# Patient Record
Sex: Male | Born: 1965 | Race: White | Hispanic: No | Marital: Married | Smoking: Never smoker
Health system: Southern US, Community
[De-identification: ages and names within clinical notes are randomized; demographics above are authoritative.]

---

## 1997-12-28 ENCOUNTER — Emergency Department (HOSPITAL_COMMUNITY): Admission: EM | Admit: 1997-12-28 | Discharge: 1997-12-28 | Payer: Self-pay | Admitting: Emergency Medicine

## 2004-08-30 ENCOUNTER — Inpatient Hospital Stay (HOSPITAL_COMMUNITY): Admission: EM | Admit: 2004-08-30 | Discharge: 2004-09-01 | Payer: Self-pay | Admitting: Emergency Medicine

## 2004-10-03 ENCOUNTER — Encounter: Admission: RE | Admit: 2004-10-03 | Discharge: 2004-10-03 | Payer: Self-pay | Admitting: Orthopedic Surgery

## 2004-10-08 ENCOUNTER — Encounter (HOSPITAL_BASED_OUTPATIENT_CLINIC_OR_DEPARTMENT_OTHER): Admission: RE | Admit: 2004-10-08 | Discharge: 2005-01-06 | Payer: Self-pay | Admitting: Surgery
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
## 2008-11-08 ENCOUNTER — Encounter: Admission: RE | Admit: 2008-11-08 | Discharge: 2008-11-08 | Payer: Self-pay | Admitting: Unknown Physician Specialty

## 2009-12-25 IMAGING — CR DG CHEST 2V
2 series · 2 of 2 positions shown · non-contrast
Comparison: 08/30/2004

CLINICAL DATA: Shortness of breath

CHEST - 2 VIEW

[view not recorded (1 of 2)]
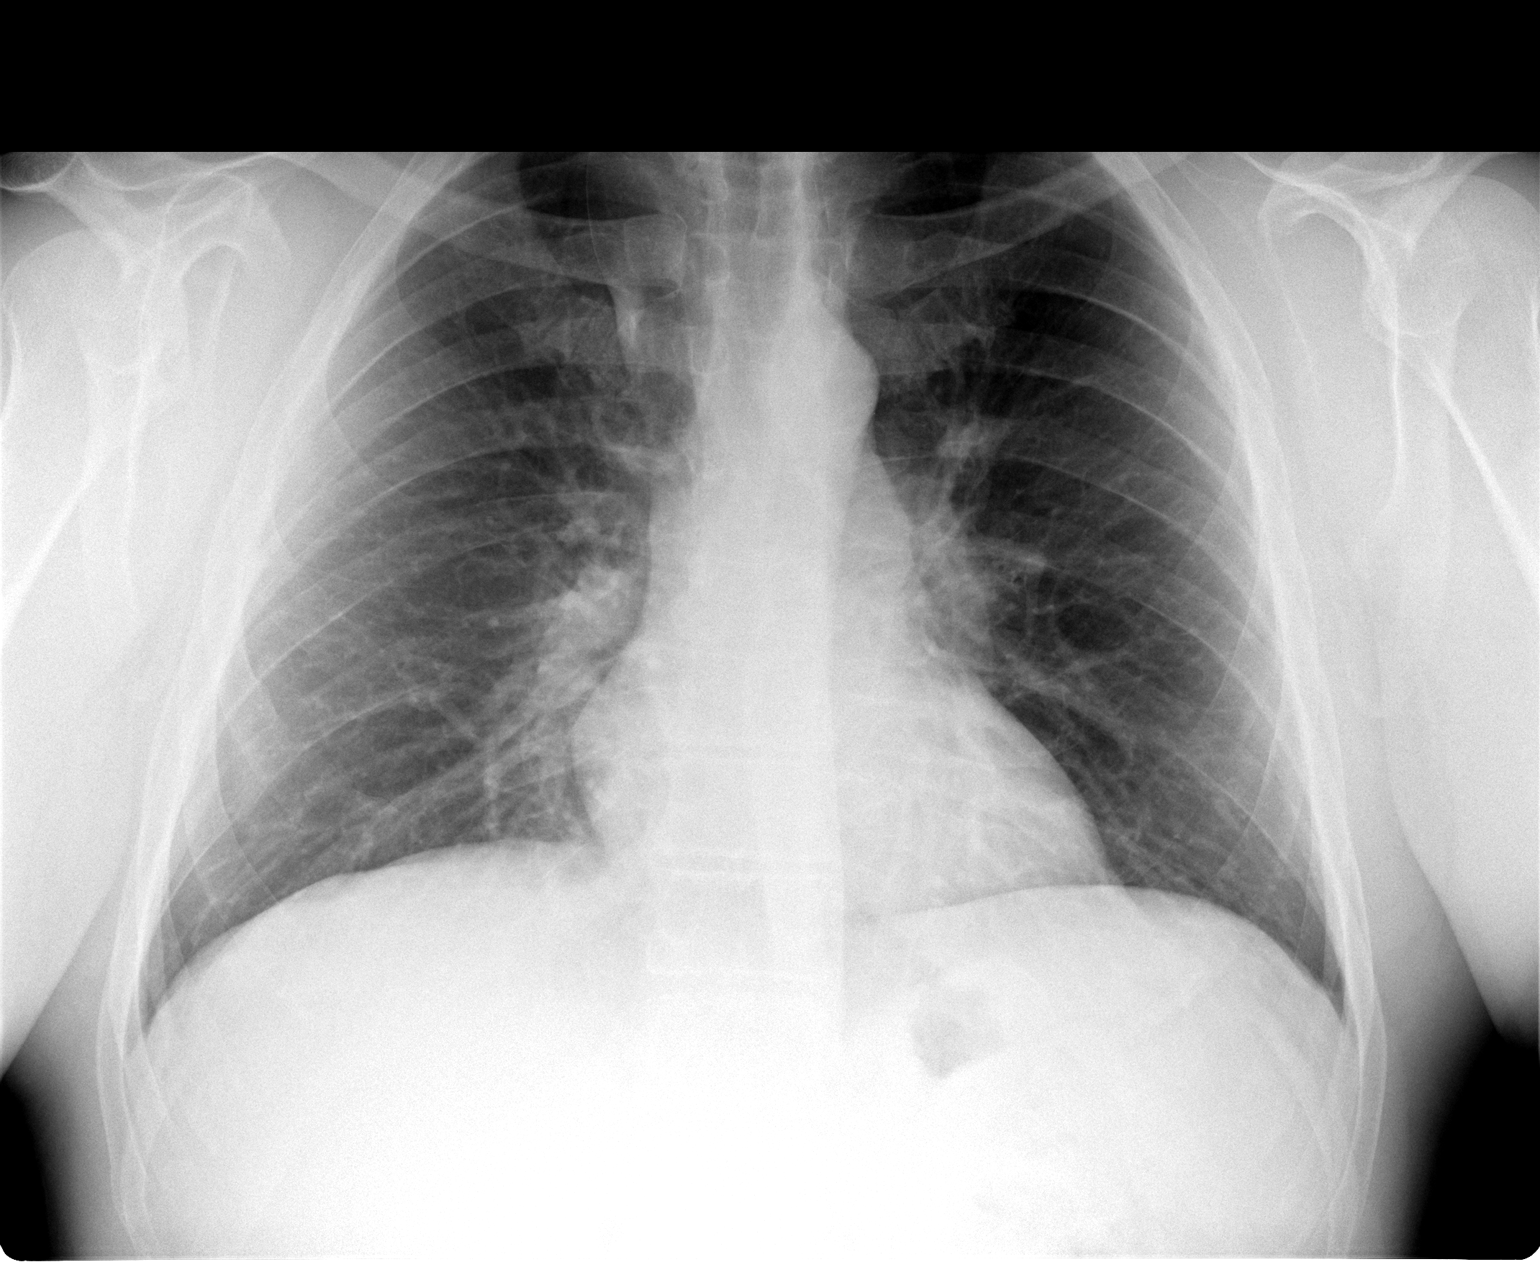

[view not recorded (2 of 2)]
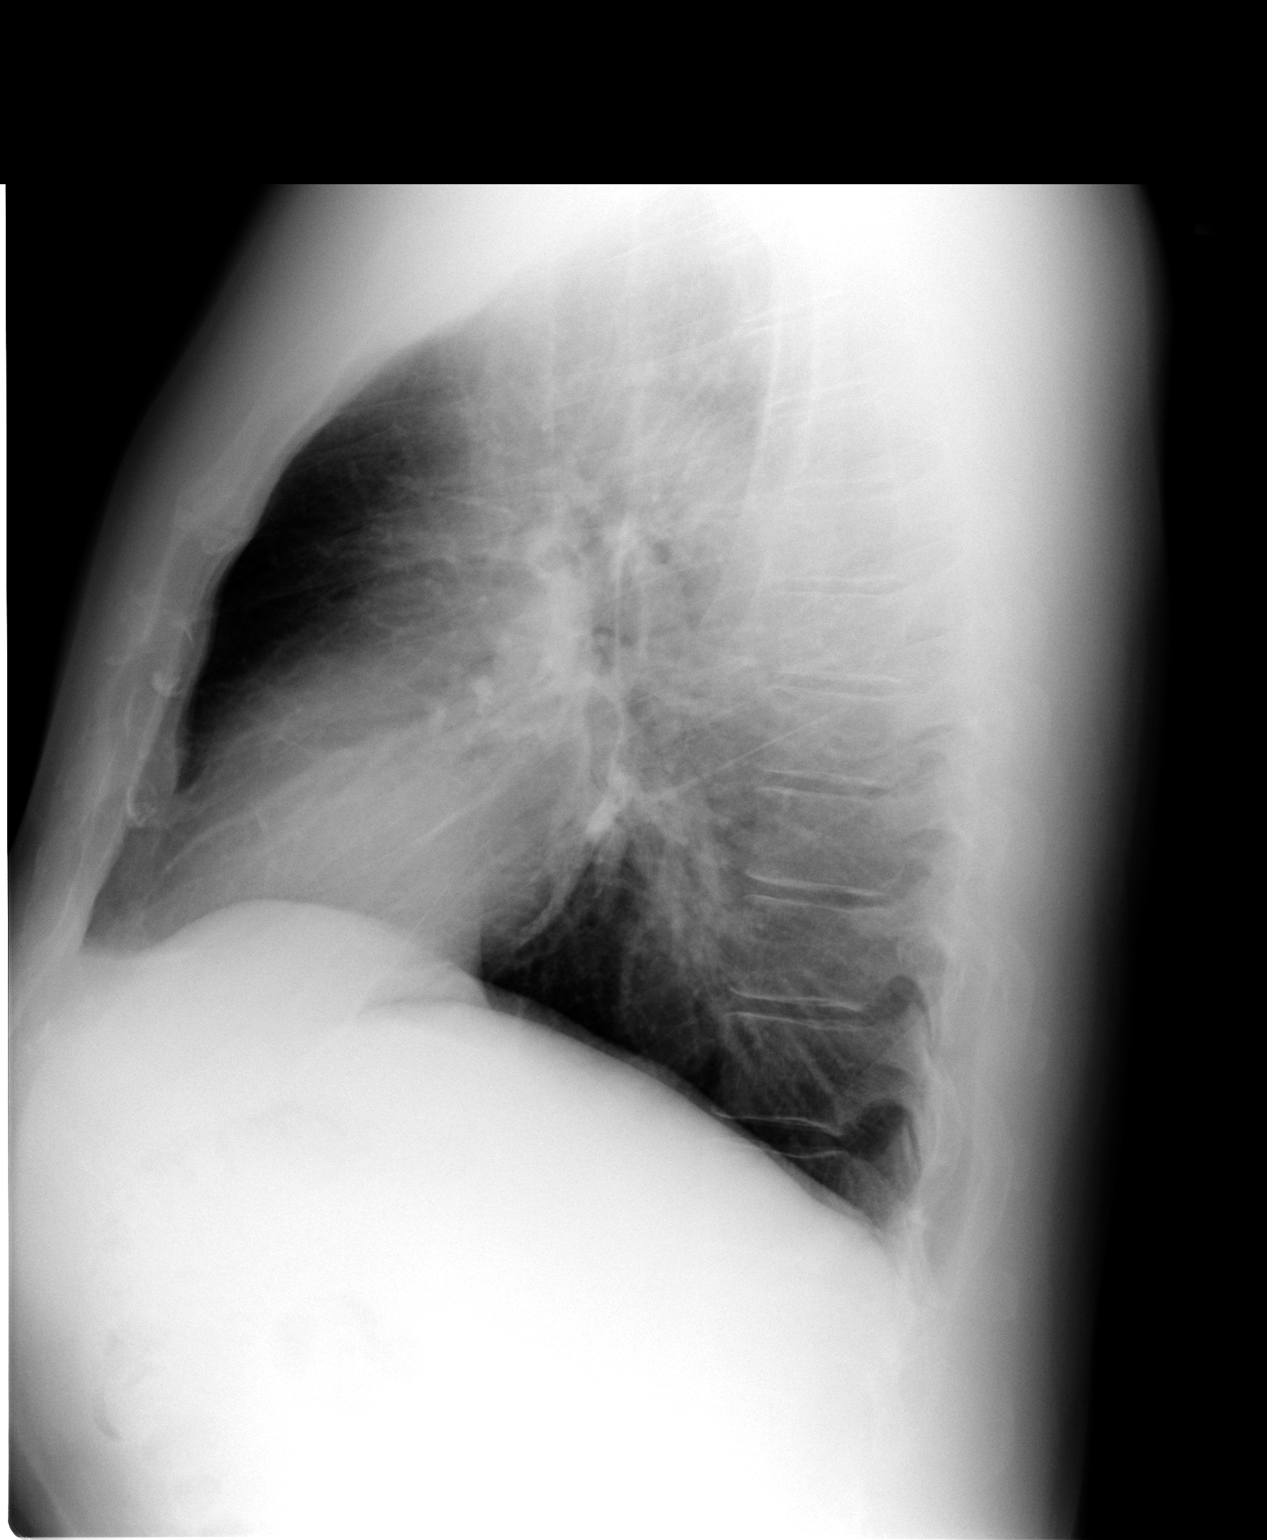

[2 of 2 positions shown; findings below may reference images not displayed]

FINDINGS: Heart and mediastinal contours are within normal limits.
No focal opacities or effusions.  No acute bony abnormality.
IMPRESSION: No active disease.

## 2010-08-30 NOTE — Op Note (Signed)
Daniel [name], Daniel [name]                ACCOUNT NO.:  [id number]   MEDICAL RECORD NO.:  [id number]          PATIENT TYPE:  INP   LOCATION:  3020                         FACILITY:  [facility]   PHYSICIAN:  Dyke Brackett, M.D.    DATE OF BIRTH:  1965/08/04   DATE OF PROCEDURE:  08/30/2004  DATE OF DISCHARGE:  09/01/2004                                 OPERATIVE REPORT   PREOPERATIVE DIAGNOSIS:  Displaced bimalleolar ankle fracture.   POSTOPERATIVE DIAGNOSIS:  Displaced bimalleolar ankle fracture.   OPERATION:  1.  Open reduction and internal fixation of bimalleolar ankle fracture.  2.  Open reduction and internal fixation of syndesmosis.   SURGEON:  Dyke Brackett, MD.   ESTIMATED BLOOD LOSS:  Minimal.   TOURNIQUET TIME:  Approximately 75 minutes.   DESCRIPTION OF PROCEDURE:  The patient had a lateral fibular fracture fixed  with a plate in anatomic reduction, then this was dictated from memory.  To  my recollection, he required a syndesmotic screw to be placed distal to the  plate.  I believe it was a Weber type C fracture.  The medial malleolus,  again to the best of my recollection, was fixed with two cannulated 4.5  screws for an anatomic reduction.  Good reduction confirmed in AP, lateral,  and mortise views.  Copious irrigation, closure with 0 and 2-0 Vicryl, and  skin clips.  Marcaine without epinephrine placed in the skin.      Dyke Brackett, M.D.  Electronically Signed     WDC/MEDQ  D:  12/24/2004  T:  12/24/2004  Job:  [id number]
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
## 2016-12-12 DIAGNOSIS — Z1211 Encounter for screening for malignant neoplasm of colon: Secondary | ICD-10-CM | POA: Diagnosis not present

## 2016-12-12 DIAGNOSIS — K648 Other hemorrhoids: Secondary | ICD-10-CM | POA: Diagnosis not present

## 2016-12-12 DIAGNOSIS — K5730 Diverticulosis of large intestine without perforation or abscess without bleeding: Secondary | ICD-10-CM | POA: Diagnosis not present

## 2017-01-09 DIAGNOSIS — R531 Weakness: Secondary | ICD-10-CM | POA: Diagnosis not present

## 2017-01-09 DIAGNOSIS — R5383 Other fatigue: Secondary | ICD-10-CM | POA: Diagnosis not present

## 2017-01-09 DIAGNOSIS — Z0000 Encounter for general adult medical examination without abnormal findings: Secondary | ICD-10-CM | POA: Diagnosis not present

## 2017-01-09 DIAGNOSIS — M19071 Primary osteoarthritis, right ankle and foot: Secondary | ICD-10-CM | POA: Diagnosis not present

## 2017-01-09 DIAGNOSIS — E7800 Pure hypercholesterolemia, unspecified: Secondary | ICD-10-CM | POA: Diagnosis not present

## 2017-01-09 DIAGNOSIS — K5730 Diverticulosis of large intestine without perforation or abscess without bleeding: Secondary | ICD-10-CM | POA: Diagnosis not present

## 2017-11-27 DIAGNOSIS — R42 Dizziness and giddiness: Secondary | ICD-10-CM | POA: Diagnosis not present

## 2017-11-27 DIAGNOSIS — Z7689 Persons encountering health services in other specified circumstances: Secondary | ICD-10-CM | POA: Diagnosis not present

## 2017-11-27 DIAGNOSIS — Z85820 Personal history of malignant melanoma of skin: Secondary | ICD-10-CM | POA: Diagnosis not present

## 2017-12-08 DIAGNOSIS — E785 Hyperlipidemia, unspecified: Secondary | ICD-10-CM | POA: Diagnosis not present

## 2017-12-08 DIAGNOSIS — R42 Dizziness and giddiness: Secondary | ICD-10-CM | POA: Diagnosis not present

## 2018-01-11 DIAGNOSIS — E7800 Pure hypercholesterolemia, unspecified: Secondary | ICD-10-CM | POA: Diagnosis not present

## 2018-01-11 DIAGNOSIS — Z0000 Encounter for general adult medical examination without abnormal findings: Secondary | ICD-10-CM | POA: Diagnosis not present

## 2018-01-11 DIAGNOSIS — K5730 Diverticulosis of large intestine without perforation or abscess without bleeding: Secondary | ICD-10-CM | POA: Diagnosis not present

## 2018-01-11 DIAGNOSIS — M19071 Primary osteoarthritis, right ankle and foot: Secondary | ICD-10-CM | POA: Diagnosis not present

## 2018-01-19 DIAGNOSIS — R5383 Other fatigue: Secondary | ICD-10-CM | POA: Diagnosis not present

## 2018-01-19 DIAGNOSIS — Z0000 Encounter for general adult medical examination without abnormal findings: Secondary | ICD-10-CM | POA: Diagnosis not present

## 2018-01-19 DIAGNOSIS — E538 Deficiency of other specified B group vitamins: Secondary | ICD-10-CM | POA: Diagnosis not present

## 2019-12-08 ENCOUNTER — Encounter (HOSPITAL_BASED_OUTPATIENT_CLINIC_OR_DEPARTMENT_OTHER): Payer: Self-pay | Admitting: Emergency Medicine

## 2019-12-08 ENCOUNTER — Other Ambulatory Visit: Payer: Self-pay

## 2019-12-08 ENCOUNTER — Emergency Department (HOSPITAL_BASED_OUTPATIENT_CLINIC_OR_DEPARTMENT_OTHER)
Admission: EM | Admit: 2019-12-08 | Discharge: 2019-12-08 | Disposition: A | Discharge status: Home / Self Care | Payer: 59 | Attending: Emergency Medicine | Admitting: Emergency Medicine

## 2019-12-08 DIAGNOSIS — Y999 Unspecified external cause status: Secondary | ICD-10-CM | POA: Diagnosis not present

## 2019-12-08 DIAGNOSIS — Y929 Unspecified place or not applicable: Secondary | ICD-10-CM | POA: Insufficient documentation

## 2019-12-08 DIAGNOSIS — W268XXA Contact with other sharp object(s), not elsewhere classified, initial encounter: Secondary | ICD-10-CM | POA: Insufficient documentation

## 2019-12-08 DIAGNOSIS — S0502XA Injury of conjunctiva and corneal abrasion without foreign body, left eye, initial encounter: Secondary | ICD-10-CM | POA: Diagnosis not present

## 2019-12-08 DIAGNOSIS — Y939 Activity, unspecified: Secondary | ICD-10-CM | POA: Insufficient documentation

## 2019-12-08 MED ORDER — FLUORESCEIN SODIUM 1 MG OP STRP: 1.0000 | ORAL_STRIP | Freq: Once | OPHTHALMIC | Status: AC

## 2019-12-08 MED ORDER — FLUORESCEIN SODIUM 1 MG OP STRP
ORAL_STRIP | OPHTHALMIC | Status: AC
  Administered 2019-12-08: 1 via OPHTHALMIC
  Filled 2019-12-08: qty 1

## 2019-12-08 MED ORDER — TETRACAINE HCL 0.5 % OP SOLN: 1.0000 [drp] | Freq: Once | OPHTHALMIC | Status: AC

## 2019-12-08 MED ORDER — ERYTHROMYCIN 5 MG/GM OP OINT: TOPICAL_OINTMENT | OPHTHALMIC | 0 refills | Status: AC

## 2019-12-08 MED ORDER — TETRACAINE HCL 0.5 % OP SOLN
OPHTHALMIC | Status: AC
  Administered 2019-12-08: 1 [drp] via OPHTHALMIC
  Filled 2019-12-08: qty 4

## 2019-12-08 NOTE — ED Triage Notes (Signed)
L eye pain since last night. He was cutting a board and thinks a piece of wood flew under his glasses.

## 2019-12-08 NOTE — ED Provider Notes (Signed)
[facility] EMERGENCY DEPARTMENT Provider Note   CSN: [id number] Arrival date & time: 12/08/19  1022     History Chief Complaint  Patient presents with  . Eye Problem    Daniel [name] is a 54 y.o. male presents to ER for evaluation of left eye discomfort.  He was working on his [employer] when he suddenly felt something go in his left eye. He was wearing eye protection but thinks because he was sweating it slid down his nose.  Feels like painful something stuck in the upper part of eye.  Has tried irrigating it with eye solution last night.  Eye vision on left is slightly blurred. Has associated clear constant tearing, light sensitivity.  No contact lens use, wears readers. No headache.  HPI     History reviewed. No pertinent past medical history.  There are no problems to display for this patient.   History reviewed. No pertinent surgical history.     No family history on file.  Social History   Tobacco Use  . Smoking status: Never Smoker  . Smokeless tobacco: Never Used  Substance Use Topics  . Alcohol use: Yes  . Drug use: Never    Home Medications Prior to Admission medications   Medication Sig Start Date End Date Taking? Authorizing Provider  erythromycin ophthalmic ointment Place a 1/2 inch ribbon of ointment into the lower eyelid four times daily for 5 days 12/08/19   Liberty Handy, PA-C    Allergies    Morphine and related  Review of Systems   Review of Systems  Eyes: Positive for photophobia, pain, discharge, redness and visual disturbance.  All other systems reviewed and are negative.   Physical Exam Updated Vital Signs BP 115/63 (BP Location: Right Arm)   Pulse 60   Temp 98.8 F (37.1 C) (Oral)   Resp 16   Ht 5\' 10"  (1.778 m)   Wt 86.2 kg   SpO2 98%   BMI 27.26 kg/m   Physical Exam Vitals and nursing note reviewed.  Constitutional:      General: He is not in acute distress.    Appearance: He is  well-developed.     Comments: NAD.  HENT:     Head: Normocephalic and atraumatic.     Right Ear: External ear normal.     Left Ear: External ear normal.     Nose: Nose normal.  Eyes:     General: No scleral icterus.    Conjunctiva/sclera: Conjunctivae normal.     Comments: 2 small punctuate 1-2 mm fluorescein uptake directly over pupil.  Diffuse scleral injection.  No obvious foreign bodies noted.  Full ROM without pain.  Normal lid margins, lids, eyelashes. No periorbital tenderness, normal soft tissues   Cardiovascular:     Rate and Rhythm: Normal rate and regular rhythm.     Heart sounds: Normal heart sounds. No murmur heard.   Pulmonary:     Effort: Pulmonary effort is normal.     Breath sounds: Normal breath sounds. No wheezing.  Musculoskeletal:        General: No deformity. Normal range of motion.     Cervical back: Normal range of motion and neck supple.  Skin:    General: Skin is warm and dry.     Capillary Refill: Capillary refill takes less than 2 seconds.  Neurological:     Mental Status: He is alert and oriented to person, place, and time.  Psychiatric:  Behavior: Behavior normal.        Thought Content: Thought content normal.        Judgment: Judgment normal.     ED Results / Procedures / Treatments   Labs (all labs ordered are listed, but only abnormal results are displayed) Labs Reviewed - No data to display  EKG None  Radiology No results found.  Procedures Procedures (including critical care time)  Medications Ordered in ED Medications  tetracaine (PONTOCAINE) 0.5 % ophthalmic solution 1 drop (1 drop Left Eye Given by Other 12/08/19 1325)  fluorescein ophthalmic strip 1 strip (1 strip Left Eye Given 12/08/19 1325)    ED Course  I have reviewed the triage vital signs and the nursing notes.  Pertinent labs & imaging results that were available during my care of the patient were reviewed by me and considered in my medical decision making  (see chart for details).  Clinical Course as of Dec 08 1422  Thu Dec 08, 2019  1323 Visual Acuity Bilateral Distance: 20/25 (Pt did not use corrective lenses for these exams) R Distance: 20/30 L Distance: 20/50   [CG]    Clinical Course User Index [CG] Liberty Handy, PA-C   MDM Rules/Calculators/A&P                          Exam consistent with small corneal abrasions.  Pain immediately resolved with tetracaine administration. Eye irrigated by me and did not visualize any FB.  Slightly decreased visual acuity in affected eye. No pain with EOMs. No evidence of globe injury, cellulitis. No FB seen. No conjunctivitis. Symptoms and eye irritation completely resolved at the end of exam. Will dc with antibiotic eye ointment, ophthalmology in 72 hours if symptoms continue. Return precautions given.   Final Clinical Impression(s) / ED Diagnoses Final diagnoses:  Abrasion of left cornea, initial encounter    Rx / DC Orders ED Discharge Orders         Ordered    erythromycin ophthalmic ointment        12/08/19 1321           Liberty Handy, PA-C 12/08/19 1427    Virgina Norfolk, DO 12/08/19 1538

## 2019-12-08 NOTE — Discharge Instructions (Signed)
You were seen in the ER for left eye discomfort  Exam revealed two punctuate corneal abrasions  Use ointment four times daily  Protect from wind, sun  Eye doctor follow up in 3-5 days if symptoms not improving  Return for sudden onset loss of vision, redness warmth pus fever or severe headache

## 2020-01-20 ENCOUNTER — Other Ambulatory Visit: Payer: Self-pay | Admitting: Unknown Physician Specialty

## 2020-01-20 ENCOUNTER — Telehealth: Payer: Self-pay | Admitting: Unknown Physician Specialty

## 2020-01-20 DIAGNOSIS — U071 COVID-19: Secondary | ICD-10-CM

## 2020-01-20 DIAGNOSIS — E663 Overweight: Secondary | ICD-10-CM

## 2020-01-20 NOTE — Telephone Encounter (Signed)
I connected by phone with Daniel [name] on 01/20/2020 at 4:25 PM to discuss the potential use of a new treatment for mild to moderate COVID-19 viral infection in non-hospitalized patients.  This patient is a 54 y.o. male that meets the FDA criteria for Emergency Use Authorization of COVID monoclonal antibody casirivimab/imdevimab or bamlanivimab/eteseviamb.  Has a (+) direct SARS-CoV-2 viral test result  Has mild or moderate COVID-19   Is NOT hospitalized due to COVID-19  Is within 10 days of symptom onset  Has at least one of the high risk factor(s) for progression to severe COVID-19 and/or hospitalization as defined in EUA.  Specific high risk criteria : BMI > 25   I have spoken and communicated the following to the patient or parent/caregiver regarding COVID monoclonal antibody treatment:  1. FDA has authorized the emergency use for the treatment of mild to moderate COVID-19 in adults and pediatric patients with positive results of direct SARS-CoV-2 viral testing who are 27 years of age and older weighing at least 40 kg, and who are at high risk for progressing to severe COVID-19 and/or hospitalization.  2. The significant known and potential risks and benefits of COVID monoclonal antibody, and the extent to which such potential risks and benefits are unknown.  3. Information on available alternative treatments and the risks and benefits of those alternatives, including clinical trials.  4. Patients treated with COVID monoclonal antibody should continue to self-isolate and use infection control measures (e.g., wear mask, isolate, social distance, avoid sharing personal items, clean and disinfect "high touch" surfaces, and frequent handwashing) according to CDC guidelines.   5. The patient or parent/caregiver has the option to accept or refuse COVID monoclonal antibody treatment.  After reviewing this information with the patient, the patient has agreed to receive one of the  available covid 19 monoclonal antibodies and will be provided an appropriate fact sheet prior to infusion. [name], NP 01/20/2020 4:25 PM Sx onset Oct 2

## 2020-01-21 ENCOUNTER — Ambulatory Visit (HOSPITAL_COMMUNITY)
Admission: RE | Admit: 2020-01-21 | Discharge: 2020-01-21 | Disposition: A | Discharge status: Home / Self Care | Payer: 59 | Source: Ambulatory Visit | Attending: Pulmonary Disease | Admitting: Pulmonary Disease

## 2020-01-21 DIAGNOSIS — U071 COVID-19: Secondary | ICD-10-CM | POA: Diagnosis present

## 2020-01-21 DIAGNOSIS — E663 Overweight: Secondary | ICD-10-CM | POA: Diagnosis present

## 2020-01-21 MED ORDER — EPINEPHRINE 0.3 MG/0.3ML IJ SOAJ: 0.3000 mg | Freq: Once | INTRAMUSCULAR | Status: DC | PRN

## 2020-01-21 MED ORDER — SODIUM CHLORIDE 0.9 % IV SOLN: INTRAVENOUS | Status: DC | PRN

## 2020-01-21 MED ORDER — SODIUM CHLORIDE 0.9 % IV SOLN: Freq: Once | INTRAVENOUS | Status: AC

## 2020-01-21 MED ORDER — DIPHENHYDRAMINE HCL 50 MG/ML IJ SOLN: 50.0000 mg | Freq: Once | INTRAMUSCULAR | Status: DC | PRN

## 2020-01-21 MED ORDER — SODIUM CHLORIDE 0.9 % IV SOLN
Freq: Once | INTRAVENOUS | Status: DC
  Filled 2020-01-21: qty 20

## 2020-01-21 MED ORDER — FAMOTIDINE IN NACL 20-0.9 MG/50ML-% IV SOLN: 20.0000 mg | Freq: Once | INTRAVENOUS | Status: DC | PRN

## 2020-01-21 MED ORDER — ACETAMINOPHEN 325 MG PO TABS
650.0000 mg | ORAL_TABLET | Freq: Once | ORAL | Status: AC
  Administered 2020-01-21: 650 mg via ORAL
  Filled 2020-01-21: qty 2

## 2020-01-21 MED ORDER — ALBUTEROL SULFATE HFA 108 (90 BASE) MCG/ACT IN AERS: 2.0000 | INHALATION_SPRAY | Freq: Once | RESPIRATORY_TRACT | Status: DC | PRN

## 2020-01-21 MED ORDER — METHYLPREDNISOLONE SODIUM SUCC 125 MG IJ SOLR: 125.0000 mg | Freq: Once | INTRAMUSCULAR | Status: DC | PRN

## 2020-01-21 NOTE — Progress Notes (Signed)
  Diagnosis: COVID-19  Physician: Patrick Wright, MD  Procedure: Covid Infusion Clinic Med: casirivimab\imdevimab infusion - Provided patient with casirivimab\imdevimab fact sheet for patients, parents and caregivers prior to infusion.  Complications: No immediate complications noted.  Discharge: Discharged home   Pebble Botkin N Glenn Gullickson 01/21/2020   

## 2020-01-21 NOTE — Discharge Instructions (Signed)

## 2023-01-01 ENCOUNTER — Emergency Department (HOSPITAL_COMMUNITY)
Admission: EM | Admit: 2023-01-01 | Discharge: 2023-01-13 | Disposition: E | Payer: 59 | Attending: Emergency Medicine | Admitting: Emergency Medicine

## 2023-01-01 DIAGNOSIS — S9002XA Contusion of left ankle, initial encounter: Secondary | ICD-10-CM | POA: Diagnosis not present

## 2023-01-01 DIAGNOSIS — Y9355 Activity, bike riding: Secondary | ICD-10-CM | POA: Insufficient documentation

## 2023-01-01 DIAGNOSIS — I469 Cardiac arrest, cause unspecified: Secondary | ICD-10-CM | POA: Diagnosis not present

## 2023-01-01 DIAGNOSIS — S99911A Unspecified injury of right ankle, initial encounter: Secondary | ICD-10-CM | POA: Diagnosis present
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
## 2023-01-13 NOTE — ED Notes (Signed)
TOD 1812.

## 2023-01-13 NOTE — ED Notes (Signed)
Per charge Aundra Millet patient ok to go to the morgue.

## 2023-01-13 NOTE — Progress Notes (Signed)
January 28, 2023 1748  Spiritual Encounters  Type of Visit Initial  Care provided to: Family  Conversation partners present during encounter Physician  Referral source Trauma page (Lvl 1)  Reason for visit Patient death  OnCall Visit Yes  Spiritual Framework  Presenting Themes Impactful experiences and emotions  Community/Connection Friend(s)  Family Stress Factors Loss  Interventions  Spiritual Care Interventions Made Established relationship of care and support;Compassionate presence;Bereavement/grief support;Supported grief process  Intervention Outcomes  Outcomes Connection to spiritual care   Chaplain responded to Lvl 1 Trauma page. Chaplain was alerted by NTs that CPR stopped and TOD called.   Chaplain found pt's spouse Byrd Hesselbach in ED lobby with friend. Escorted to consult room. Chaplain accompanied DO to notify spouse of death. Spouse requested pt's belongings, particular wallet and all jewelry; however, spouse was notified that belongings must remain with pt until after ME arrives. Spouse declined to see pt in room.
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
## 2023-01-13 NOTE — ED Notes (Signed)
Unable to complete patient vitals due to patient condition.

## 2023-01-13 NOTE — ED Notes (Signed)
Dr. Lockie Mola at bedside.

## 2023-01-13 NOTE — Progress Notes (Addendum)
Orthopedic Tech Progress Note Patient Details:  Daniel [name] [birth year] [id number]  Level 1 Patient ID: Clide Cliff, male   DOB: 26-Jul-1965, 57 y.o.   MRN: [id number]  Sherilyn Banker 12/16/2022, 6:21 PM

## 2023-01-13 NOTE — ED Notes (Signed)
Unable to complete triage due to patient condition.

## 2023-01-13 DEATH — deceased
